# Patient Record
Sex: Male | Born: 1970 | Race: White | Hispanic: No | Marital: Married | Smoking: Former smoker
Health system: Southern US, Community
[De-identification: ages and names within clinical notes are randomized; demographics above are authoritative.]

## PROBLEM LIST (undated history)

## (undated) DIAGNOSIS — Z8619 Personal history of other infectious and parasitic diseases: Secondary | ICD-10-CM

## (undated) HISTORY — DX: Personal history of other infectious and parasitic diseases: Z86.19

## (undated) HISTORY — PX: TONSILLECTOMY AND ADENOIDECTOMY: SUR1326

---

## 2006-03-11 ENCOUNTER — Emergency Department: Payer: Self-pay | Admitting: Emergency Medicine

## 2013-08-02 ENCOUNTER — Encounter: Payer: Self-pay | Admitting: Family Medicine

## 2013-08-02 ENCOUNTER — Ambulatory Visit (INDEPENDENT_AMBULATORY_CARE_PROVIDER_SITE_OTHER): Payer: 59 | Admitting: Family Medicine

## 2013-08-02 VITALS — BP 140/92 | HR 81 | Temp 98.2°F | Ht 68.5 in | Wt 179.5 lb

## 2013-08-02 DIAGNOSIS — S99919A Unspecified injury of unspecified ankle, initial encounter: Secondary | ICD-10-CM

## 2013-08-02 DIAGNOSIS — S8990XA Unspecified injury of unspecified lower leg, initial encounter: Secondary | ICD-10-CM

## 2013-08-02 DIAGNOSIS — S99929A Unspecified injury of unspecified foot, initial encounter: Secondary | ICD-10-CM

## 2013-08-02 DIAGNOSIS — S8991XA Unspecified injury of right lower leg, initial encounter: Secondary | ICD-10-CM | POA: Insufficient documentation

## 2013-08-02 NOTE — Progress Notes (Signed)
Pre visit review using our clinic review tool, if applicable. No additional management support is needed unless otherwise documented below in the visit note. 

## 2013-08-02 NOTE — Progress Notes (Signed)
BP 140/92  Pulse 81  Temp(Src) 98.2 F (36.8 C) (Oral)  Ht 5' 8.5" (1.74 m)  Wt 179 lb 8 oz (81.421 kg)  BMI 26.89 kg/m2  SpO2 98%   CC: new pt to establish care  Subjective:    Patient ID: Alan Daniels, male    DOB: [birth year], 43 y.o.   MRN: [id number]  HPI: Alan DapperHoward Kaufman is a 43 y.o. male presenting on 08/02/2013 for Establish Care   [facility] Stressful few weeks at work - attributes slightly elevated blood pressure to this.  R knee pain - fall down stairs about 1.5 wks ago.  Slid down 3 steps and R knee got brunt of fall.  Describes R leg bent backwards.  Some pain immediately but worse stiffness next morning.  Able to flex knee to 90 degrees but any more causes pain, more pain with lateral rotation of foot as well.  Denies knee swelling or bruising.  No paresthesias.  No weakness.  Worse with activity/exertion.  Significant walking at work currently.  No h/o knee injury/surgery in the past.  Has been using knee brace since fall.  Hasn't tried meds for this.  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 Edu: HS Activity: active with children Diet: good water, fruits/vegetables daily  Relevant past medical, surgical, family and social history reviewed and updated as indicated.  Allergies and medications reviewed and updated. No current outpatient prescriptions on file prior to visit.   No current facility-administered medications on file prior to visit.    Review of Systems Per HPI unless specifically indicated above    Objective:    BP 140/92  Pulse 81  Temp(Src) 98.2 F (36.8 C) (Oral)  Ht 5' 8.5" (1.74 m)  Wt 179 lb 8 oz (81.421 kg)  BMI 26.89 kg/m2  SpO2 98%  Physical Exam  Nursing note and vitals reviewed. Constitutional: He is  oriented to person, place, and time. He appears well-developed and well-nourished. No distress.  HENT:  Head: Normocephalic and atraumatic.  Mouth/Throat: Uvula is midline, oropharynx is clear and moist and mucous membranes are normal.  Eyes: Conjunctivae and EOM are normal. Pupils are equal, round, and reactive to light. No scleral icterus.  Neck: Normal range of motion. Neck supple. No thyromegaly present.  Cardiovascular: Normal rate, regular rhythm, normal heart sounds and intact distal pulses.   No murmur heard. Pulses:      Radial pulses are 2+ on the right side, and 2+ on the left side.  Pulmonary/Chest: Effort normal and breath sounds normal. No respiratory distress. He has no wheezes. He has no rales.  Musculoskeletal: He exhibits no edema.       Right knee: He exhibits decreased range of motion. Tenderness found. MCL tenderness noted.       Left knee: Normal.  L knee WNL R Knee exam: No deformity on inspection. No bruising noted Tender to palpation along R MCL  No significant effusion/swelling noted. FROM in extension but limited to 100d with flexion No popliteal fullness. Neg drawer test. Neg mcmurray test. Mild pain with valgus stressing. No PFgrind. No abnormal patellar mobility. No pain with palpation of VMO, muscle mass symmetrical on both sides  Lymphadenopathy:    He has no cervical adenopathy.  Neurological: He is alert and oriented  to person, place, and time.  CN grossly intact, station and gait intact  Skin: Skin is warm and dry. No rash noted.  Psychiatric: He has a normal mood and affect. His behavior is normal. Judgment and thought content normal.   No results found for this or any previous visit.    Assessment & Plan:   Problem List Items Addressed This Visit   Right knee injury - Primary     Anticipate R MCL strain not full tear.  Not consistent with meniscal or muscular injury today.  ACL/PCL seem intact today. Recommend continued brace use, start  ibuprofen 400-600mg  bid with meals for 5 days, start icing knee at night time, and continue elevation at night. Provided with patient handout on MCL strain from [facility] pt advisor. I did recommend he return to see our SM doc in 1 week for follow up visit, return sooner if any worsening sxs. Pt agrees with plan.    Relevant Orders      Ambulatory referral to Sports Medicine       Follow up plan: Return as needed, for follow up with sports medicine.

## 2013-08-02 NOTE — Patient Instructions (Signed)
I think you have medial ligament strain. Start taking ibuprofen 400-600mg  twice a day with meals for next 5 days.  Ice leg when you're at home. Continue brace during the day. If worsening instead of improving let us know otherwise follow up in 1 week with Dr. [facility] for further rehab.

## 2013-08-02 NOTE — Assessment & Plan Note (Signed)
Anticipate R MCL strain not full tear.  Not consistent with meniscal or muscular injury today.  ACL/PCL seem intact today. Recommend continued brace use, start ibuprofen 400-600mg  bid with meals for 5 days, start icing knee at night time, and continue elevation at night. Provided with patient handout on [facility] strain [facility]. I did recommend he return to see our SM doc in 1 week for follow up visit, return sooner if any worsening sxs. Pt agrees with plan.

## 2013-08-09 ENCOUNTER — Ambulatory Visit (INDEPENDENT_AMBULATORY_CARE_PROVIDER_SITE_OTHER)
Admission: RE | Admit: 2013-08-09 | Discharge: 2013-08-09 | Disposition: A | Discharge status: Home / Self Care | Payer: 59 | Source: Ambulatory Visit | Attending: Family Medicine | Admitting: Family Medicine

## 2013-08-09 ENCOUNTER — Encounter: Payer: Self-pay | Admitting: Family Medicine

## 2013-08-09 ENCOUNTER — Ambulatory Visit (INDEPENDENT_AMBULATORY_CARE_PROVIDER_SITE_OTHER): Payer: 59 | Admitting: Family Medicine

## 2013-08-09 ENCOUNTER — Ambulatory Visit: Payer: Self-pay | Admitting: Family Medicine

## 2013-08-09 VITALS — BP 132/84 | HR 93 | Temp 98.1°F | Wt 184.5 lb

## 2013-08-09 DIAGNOSIS — M25561 Pain in right knee: Secondary | ICD-10-CM

## 2013-08-09 DIAGNOSIS — S83419A Sprain of medial collateral ligament of unspecified knee, initial encounter: Secondary | ICD-10-CM

## 2013-08-09 DIAGNOSIS — M2390 Unspecified internal derangement of unspecified knee: Secondary | ICD-10-CM

## 2013-08-09 DIAGNOSIS — M25569 Pain in unspecified knee: Secondary | ICD-10-CM

## 2013-08-09 NOTE — Progress Notes (Signed)
Date:  08/09/2013   Name:  Alan Daniels   DOB:  May 31, 1970   MRN:  [id number]  Primary Physician: Eustaquio BoydenJavier Gutierrez, MD  Dear Dr. Sharen HonesGutierrez,  Thank you for having me see Alan Daniels in consultation today at [facility] for his problem with R knee pain.  As you may recall, he is a 43 y.o. year old male with a history of injuring his right knee about 2-1/2 weeks ago when he slid down several stairs. At that time his knee was forcefully flexed underneath him and his right leg bent backwards. He did have initial pain, but he did not have immediate effusion in his knee. He has been able to ambulate, but it has been somewhat difficult, and he is been limited some in the motion of his knee.  He has been using a hinged knee brace for support, and he has been using up until a couple of days ago when he stopped it when he thought his knee was feeling better. After that day, he started to develop some significantly more knee pain, and so he is wearing his knee brace once again today.  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  He has tried to take some Advil, and this is minimally helped.  He has no prior significant history of injury to his knee, no prior operative intervention and no prior fractures.  Past Medical History  Diagnosis Date  . History of chicken pox childhood    Past Surgical History  Procedure Laterality Date  . Tonsillectomy and adenoidectomy      Childhood    History   Social History  . Marital Status: Married    Spouse Name: N/A    Number of Children: N/A  . Years of Education: N/A   Social History Main Topics  . Smoking status: Former Smoker -- 1.00 packs/day for 10 years    Types: Cigarettes    Quit date: 08/03/1995  . Smokeless tobacco: Never Used  . Alcohol Use: Yes     Comment: once or twice a week  . Drug Use: No  . Sexual Activity: Yes    Partners: Female   Other Topics  Concern  . 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   Edu: HS   Activity: active with children   Diet: good water, fruits/vegetables daily    Family History  Problem Relation Age of Onset  . Hyperlipidemia Mother   . Alcohol abuse Father   . Heart failure Father   . Hypertension Father   . Cancer Neg Hx   . Diabetes Neg Hx   . CAD Neg Hx   . Stroke Neg Hx     Medications and Allergies reviewed  Review of Systems:    GEN: No fevers, chills. Nontoxic. Primarily MSK c/o today. MSK: Detailed in the HPI GI: tolerating PO intake without difficulty Neuro: No numbness, parasthesias, or tingling associated. Otherwise the pertinent positives of the ROS are noted above.   Physical Examination: BP 132/84  Pulse 93  Temp(Src) 98.1 F (36.7 C) (Oral)  Wt 184 lb 8 oz (83.689 kg)  SpO2 97%    GEN: WDWN, NAD, Non-toxic, Alert & Oriented x 3 HEENT: Atraumatic, Normocephalic.  Ears and Nose: No external deformity. EXTR: No clubbing/cyanosis/edema PSYCH: Normally interactive. Conversant. Not  depressed or anxious appearing.  Calm demeanor.   Knee:  RIGHT Gait: notably antalgic heel toe pattern ROM: 0-100 Effusion: minimal  Echymosis or edema: none Patellar tendon. Mildly tender at the distal patellar tendon insertion Painful PLICA: neg Patellar grind: negative Medial and lateral patellar facet loading: negative medial and lateral joint lines: MARKEDLY TENDER ON THE MEDIAL JOINT LINE Mcmurray's MARKEDLY POSITIVE Flexion-pinch MARKEDLY POSITIVE Varus and valgus stress: MCL and LCL are stable. No pain with varus stress. Valgus stress does cause only a minimal amount of pain. Lachman: neg Ant and Post drawer: neg Hip abduction, IR, ER: WNL Hip flexion str: 5/5 Hip abd: 5/5 Quad: 5/5 VMO atrophy:No Hamstring concentric and eccentric:  5/5   Objective Data: X-rays: AP Weight-bearing, Weightbearing Lateral, Sunrise views, R Indication: knee pain Findings:  Joint spaces are well preserved. There is no foreign body visualized. There is no evidence of occult fracture or dislocation. On the sunrise view the patient's patella is deviated somewhat laterally in the patellar groove. Otherwise, these are unremarkable knee films.  The radiological images were independently reviewed by myself in the office. My independent interpretation of images are above. Electronically Signed  By: Hannah BeatSpencer Maris Bena, MD On: 08/09/2013 6:58 PM   Assessment and Plan: 1. Highly concerning for medial meniscal tear 2. Mild grade 1 MCL sprain 3. Mild injury at patellar tendon insertion distally  Recommendations: We reviewed different treatment approaches. I am highly concerned that the patient has a medial meniscal tear. At this point, he would like to proceed conservatively, which I think is certainly reasonable. We are going to have him rest his knee over the next several weeks, continue to wear his hinged knee brace over the next several days. He denies his knee after work, and take either Advil or Aleve.  We will see the patient back in 4 weeks. At that time, the patient's knee is still considerably injured, similar to today, I would recommend an MRI of the right knee versus direct arthroscopy.  Thank you for having us see Alan Daniels in consultation.  Feel free to contact me with any questions.  Signed,  Elpidio GaleaSpencer T. Jhon Mallozzi, MD, CAQ Sports Medicine  [facility] [address] [place], [place] [address] Phone: [phone] Fax: [phone]

## 2013-08-09 NOTE — Progress Notes (Signed)
Pre visit review using our clinic review tool, if applicable. No additional management support is needed unless otherwise documented below in the visit note. 

## 2013-08-14 ENCOUNTER — Telehealth: Payer: Self-pay

## 2013-08-14 NOTE — Telephone Encounter (Signed)
Mr. Alan Daniels notified as instructed by telephone.

## 2013-08-14 NOTE — Telephone Encounter (Signed)
i thought that we had discussed in the office. Grossly normal. No significant arthritis, fracture, or dislocation.

## 2013-08-14 NOTE — Telephone Encounter (Signed)
Pt left v/m requesting knee xray results.

## 2013-08-16 ENCOUNTER — Ambulatory Visit: Payer: Self-pay | Admitting: Family Medicine

## 2013-09-13 ENCOUNTER — Ambulatory Visit: Payer: 59 | Admitting: Family Medicine

## 2016-06-10 ENCOUNTER — Ambulatory Visit (INDEPENDENT_AMBULATORY_CARE_PROVIDER_SITE_OTHER): Payer: BLUE CROSS/BLUE SHIELD | Admitting: Primary Care

## 2016-06-10 ENCOUNTER — Encounter: Payer: Self-pay | Admitting: Primary Care

## 2016-06-10 VITALS — BP 162/110 | HR 91 | Temp 98.0°F | Ht 68.5 in | Wt 188.1 lb

## 2016-06-10 DIAGNOSIS — G44019 Episodic cluster headache, not intractable: Secondary | ICD-10-CM | POA: Diagnosis not present

## 2016-06-10 MED ORDER — METHYLPREDNISOLONE ACETATE 80 MG/ML IJ SUSP
80.0000 mg | Freq: Once | INTRAMUSCULAR | Status: AC
  Administered 2016-06-10: 80 mg via INTRAMUSCULAR

## 2016-06-10 NOTE — Addendum Note (Signed)
Addended by: Tawnya CrookSAMBATH, Emerson Schreifels on: 06/10/2016 04:57 PM   Modules accepted: Orders

## 2016-06-10 NOTE — Progress Notes (Signed)
Pre visit review using our clinic review tool, if applicable. No additional management support is needed unless otherwise documented below in the visit note. 

## 2016-06-10 NOTE — Patient Instructions (Signed)
You were provided with an injection of Depo Medrol which is a steroid to abort your headache and inflammation.  Do not take any additional Advil for the rest of the evening.   Please call me if no improvement in 24 hours.  Monitor your blood pressure and notify Dr. Sharen Hones if you notice readings consistently above 140/90.  It was a pleasure meeting you!   Cluster Headache A cluster headache is a type of headache that causes deep, intense head pain. Cluster headaches can last from 15 minutes to 3 hours. They usually occur:  On one side of the head. They may occur on the other side when a new cluster of headaches begins.  Repeatedly over weeks to months.  Several times a day.  At the same time of day, often at night.  More often in the fall and springtime. What are the causes? The cause of this condition is not known. What increases the risk? This condition is more likely to develop in:  Males.  People who drink alcohol.  People who smoke or use products that contain nicotine or tobacco.  People who take medicines that cause blood vessels to expand, such as nitroglycerin.  People who take antihistamines. What are the signs or symptoms? Symptoms of this condition include:  Severe pain on one side of the head that begins behind or around your eye or temple.  Pain on one side of the head.  Nausea.  Sensitivity to light.  Runny nose and nasal stuffiness.  Sweaty, pale skin on the face.  Droopy or swollen eyelid, eye redness, or tearing.  Restlessness and agitation. How is this diagnosed? This condition may be diagnosed based on:  Your symptoms.  A physical exam. Your health care provider may order tests to see if your headaches are caused by another medical condition. These tests may show that you do not have cluster headaches. Tests may include:  A CT scan of your head.  An MRI of your head.  Lab tests. How is this treated? This condition may be  treated with:  Medicines to relieve pain and to prevent repeated (recurrent) attacks. Some people may need a combination of medicines.  Oxygen. This helps to relieve pain. Follow these instructions at home: Headache diary  Keep a headache diary as told by your health care provider. Doing this can help you and your health care provider figure out what triggers your headaches. In your headache diary, include information about:  The time of day that your headache started and what you were doing when it began.  How long your headache lasted.  Where your pain started and whether it moved to other areas.  The type of pain, such as burning, stabbing, throbbing, or cramping.  Your level of pain. Use a pain scale and rate the pain with a number from 1 (mild) up to 10 (severe).  The treatment that you used, and any change in symptoms after treatment. Medicines  Take over-the-counter and prescription medicines only as told by your health care provider.  Do not drive or use heavy machinery while taking prescription pain medicine.  Use oxygen as told by your health care provider. Lifestyle  Follow a regular sleep schedule. Do not vary the time that you go to bed or the amount that you sleep from day to day. It is important to stay on the same schedule during a cluster period to help prevent headaches.  Exercise regularly.  Eat a healthy diet and avoid foods that  may trigger your headaches.  Avoid alcohol.  Do not use any products that contain nicotine or tobacco, such as cigarettes and e-cigarettes. If you need help quitting, ask your health care provider. Contact a health care provider if:  Your headaches change, become more severe, or occur more often.  The medicine or oxygen that your health care provider recommended does not help. Get help right away if:  You faint.  You have weakness or numbness, especially on one side of your body or face.  You have double vision.  You  have nausea or vomiting that does not go away within several hours.  You have trouble talking, walking, or keeping your balance.  You have pain or stiffness in your neck.  You have a fever. Summary  A cluster headache is a type of headache that causes deep, intense head pain, usually on one side of the head.  Keep a headache diary to help discover what triggers your headaches.  A regular sleep schedule can help prevent headaches. This information is not intended to replace advice given to you by your health care provider. Make sure you discuss any questions you have with your health care provider. Document Released: 04/13/2005 Document Revised: 12/24/2015 Document Reviewed: 12/24/2015 Elsevier Interactive Patient Education  2017 [employer]

## 2016-06-10 NOTE — Progress Notes (Signed)
Subjective:    Patient ID: Alan Daniels, male    DOB: [birth year], 46 y.o.   MRN: [id number]  HPI  Alan Daniels is a 46 year old male who presents today with a chief complaint of headache. He also reports inflammation to his eye lid, watering of eyes, photophobia. His headache is located behind his right eye that has been present intermittently for the past 4 days. He describes his pain as throbbing and sharp.  He denies numbness/tingling, fevers, cough, nausea. He's taken Advil with temporary improvement.   Review of Systems  Constitutional: Negative for fatigue and fever.  HENT: Negative for congestion.   Eyes: Positive for photophobia.       Inflammation to right eye with watery eye.  Gastrointestinal: Negative for nausea.  Neurological: Positive for headaches.       Past Medical History:  Diagnosis Date  . History of chicken pox childhood     Social History   Social History  . Marital status: Married    Spouse name: N/A  . Number of children: N/A  . Years of education: N/A   Occupational History  . Not on file.   Social History Main Topics  . Smoking status: Former Smoker    Packs/day: 1.00    Years: 10.00    Types: Cigarettes    Quit date: 08/03/1995  . Smokeless tobacco: Never Used  . Alcohol use Yes     Comment: once or twice a week  . Drug use: No  . Sexual activity: Yes    Partners: Female   Other Topics Concern  . 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   Edu: HS   Activity: active with children   Diet: good water, fruits/vegetables daily    Past Surgical History:  Procedure Laterality Date  . TONSILLECTOMY AND ADENOIDECTOMY     Childhood    Family History  Problem Relation Age of Onset  . Hyperlipidemia Mother   . Alcohol abuse Father   . Heart failure Father   . Hypertension Father   .  Cancer Neg Hx   . Diabetes Neg Hx   . CAD Neg Hx   . Stroke Neg Hx     No Known Allergies  No current outpatient prescriptions on file prior to visit.   No current facility-administered medications on file prior to visit.     BP (!) 162/110   Pulse 91   Temp 98 F (36.7 C) (Oral)   Ht 5' 8.5" (1.74 m)   Wt 188 lb 1.9 oz (85.3 kg)   SpO2 96%   BMI 28.19 kg/m    Objective:   Physical Exam  Constitutional: He appears well-nourished.  HENT:  Right Ear: Tympanic membrane and ear canal normal.  Left Ear: Tympanic membrane and ear canal normal.  Nose: No mucosal edema. Right sinus exhibits no maxillary sinus tenderness and no frontal sinus tenderness. Left sinus exhibits no maxillary sinus tenderness and no frontal sinus tenderness.  Mouth/Throat: Oropharynx is clear and moist.  Eyes: Conjunctivae and EOM are normal. Pupils are equal, round, and reactive to light.  Mild swelling to right upper eye lid.   Neck: Neck supple.  Cardiovascular: Normal rate and regular rhythm.   Pulmonary/Chest: Effort normal and breath sounds normal. He has no wheezes. He has no rales.  Neurological: No cranial nerve deficit.  No decrease in sensation to face  Skin: Skin is warm and dry.          Assessment & Plan:  Cluster Headache:  Headache x 4 days, intermittent photophobia, eye irritation. Located behind right eye. Little improvement with OTC treatment. Neuro exam unremarkable. No suspicion for Bell's Palsy. Examination and symptoms represent cluster headache. Treat today with IM Depo Medrol. Hold Advil. Discussed to notify if no improvement in 24 hours. Also will have him monitor BP and report readings at or above 140/90 to PCP.  Morrie Sheldon, NP

## 2016-06-11 ENCOUNTER — Telehealth: Payer: Self-pay | Admitting: Primary Care

## 2016-06-11 ENCOUNTER — Telehealth: Payer: Self-pay | Admitting: Family Medicine

## 2016-06-11 DIAGNOSIS — G44009 Cluster headache syndrome, unspecified, not intractable: Secondary | ICD-10-CM

## 2016-06-11 NOTE — Telephone Encounter (Signed)
FYI Spouse called stating pt headache is not any better.  He made appointment with you for tomorrow

## 2016-06-11 NOTE — Telephone Encounter (Signed)
Noted  

## 2016-06-11 NOTE — Telephone Encounter (Signed)
Please call patient and tell him that I can send in a medication to his pharmacy that should abort his medication rather than coming in for another office visit. If he's agreeable I will send.

## 2016-06-12 ENCOUNTER — Ambulatory Visit: Payer: BLUE CROSS/BLUE SHIELD | Admitting: Primary Care

## 2016-06-12 MED ORDER — SUMATRIPTAN SUCCINATE 50 MG PO TABS: ORAL_TABLET | ORAL | 0 refills | Status: DC

## 2016-06-12 NOTE — Telephone Encounter (Signed)
Spoken and notified patient of Kate's comments. Patient verbalized understanding.  Patient stated that he is agreeable [facility] [address]

## 2016-06-12 NOTE — Telephone Encounter (Signed)
Noted. Rx for Imitrex sent to pharmacy.

## 2016-06-16 ENCOUNTER — Other Ambulatory Visit: Payer: Self-pay

## 2016-06-16 ENCOUNTER — Other Ambulatory Visit: Payer: Self-pay | Admitting: Primary Care

## 2016-06-16 DIAGNOSIS — G44009 Cluster headache syndrome, unspecified, not intractable: Secondary | ICD-10-CM

## 2016-06-16 NOTE — Telephone Encounter (Signed)
Male left v/m at 4:58pm requesting refill sumatriptan 50 mg. Last filled # 4 on 06/12/16. Pt has taken the 4 tablets and request refill. [facility] [address] Pt seen 06/10/16.Please advise.

## 2016-06-16 NOTE — Telephone Encounter (Signed)
If he's needing a refill then that means his headache has not dissipated. Any improvement? If not then please schedule him for follow up this week with his PCP. Thanks.

## 2016-06-17 NOTE — Telephone Encounter (Signed)
Alan Daniels request cb about sumatriptan refill.

## 2016-06-17 NOTE — Telephone Encounter (Signed)
Spoken to patient's wife and notified of Kate's comments.

## 2016-06-17 NOTE — Telephone Encounter (Addendum)
Addressed on another telephone encounter

## 2016-06-17 NOTE — Telephone Encounter (Signed)
Will call and schedule appointment with Dr Sharen HonesGutierrez

## 2016-06-18 ENCOUNTER — Ambulatory Visit (INDEPENDENT_AMBULATORY_CARE_PROVIDER_SITE_OTHER): Payer: BLUE CROSS/BLUE SHIELD | Admitting: Family Medicine

## 2016-06-18 ENCOUNTER — Encounter: Payer: Self-pay | Admitting: Family Medicine

## 2016-06-18 VITALS — BP 138/90 | HR 72 | Temp 97.7°F | Wt 184.5 lb

## 2016-06-18 DIAGNOSIS — G44009 Cluster headache syndrome, unspecified, not intractable: Secondary | ICD-10-CM | POA: Insufficient documentation

## 2016-06-18 DIAGNOSIS — G44011 Episodic cluster headache, intractable: Secondary | ICD-10-CM | POA: Diagnosis not present

## 2016-06-18 MED ORDER — VERAPAMIL HCL ER 120 MG PO TBCR: 120.0000 mg | EXTENDED_RELEASE_TABLET | Freq: Every day | ORAL | 3 refills | Status: DC

## 2016-06-18 MED ORDER — SUMATRIPTAN SUCCINATE 100 MG PO TABS: 50.0000 mg | ORAL_TABLET | ORAL | 0 refills | Status: DC | PRN

## 2016-06-18 MED ORDER — SUMATRIPTAN SUCCINATE 6 MG/0.5ML ~~LOC~~ SOLN
6.0000 mg | Freq: Once | SUBCUTANEOUS | Status: AC
  Administered 2016-06-18: 6 mg via SUBCUTANEOUS

## 2016-06-18 NOTE — Assessment & Plan Note (Addendum)
New, ongoing over last 2 weeks. Classical cluster headache. Autonomic symptoms present. Improved with steroid injection and sumatriptan 50mg  at home daily however recurred.  Treat today with 10 L/min oxygen via nonrebreather. Not fully improved, so we also treated with sumatriptan 6mg  IM.  Given duration of headache, start low dose verapamil preventatively. Pt may stop after 1 month if well controlled.  As new onset cluster HA, will check noncontrasted head CT to r/o secondary cause of cluster headache.  Update us with effect. Pt agrees with plan.

## 2016-06-18 NOTE — Progress Notes (Signed)
BP 138/90   Pulse 72   Temp 97.7 F (36.5 C) (Tympanic)   Wt 184 lb 8 oz (83.7 kg)   BMI 27.65 kg/m    CC: headache Subjective:    Patient ID: Shawnie DapperHoward Blakesley, male    DOB: Sep 18, 1970, 46 y.o.   MRN: [id number]  HPI: Shawnie DapperHoward Guettler is a 46 y.o. male presenting on 06/18/2016 for Headache (recheck)   Saw Mayra ReelKate Clark last week, dx with cluster headaches, treated with methylprednisolone 80mg  IM. Temporary relief x3 days. Then treated with imitrex 50mg  to pharmacy #4. He has taken one a day. + night sweats. Some nausea at its peak. +eye tearing, injection, rhinorrhea/congestion. Currently 8/10 pain. Headache worsens as day progresses.   Elevated blood pressure recently - ?pain related.   2.5 wk h/o R headache with R ptosis and puffiness of medial upper eyelid.  No pain on left side of head.  No fevers/chills, vomiting, vision changes.  No inciting trauma/injury or falls.  Ex smoker  No significant h/o headaches in the past.  BP Readings from Last 3 Encounters:  06/18/16 138/90  06/10/16 (!) 162/110  08/09/13 132/84     Relevant past medical, surgical, family and social history reviewed and updated as indicated. Interim medical history since our last visit reviewed. Allergies and medications reviewed and updated. Outpatient Medications Prior to Visit  Medication Sig Dispense Refill  . SUMAtriptan (IMITREX) 50 MG tablet Take 1 tablet by mouth at migraine onset. May repeat in 2 hours if headache persists or recurs. Caution as this may cause drowsiness. 4 tablet 0   No facility-administered medications prior to visit.      Per HPI unless specifically indicated in ROS section below Review of Systems     Objective:    BP 138/90   Pulse 72   Temp 97.7 F (36.5 C) (Tympanic)   Wt 184 lb 8 oz (83.7 kg)   BMI 27.65 kg/m   Wt Readings from Last 3 Encounters:  06/18/16 184 lb 8 oz (83.7 kg)  06/10/16 188 lb 1.9 oz (85.3 kg)  08/09/13 184 lb 8 oz (83.7 kg)    Physical Exam    Constitutional: He appears well-developed and well-nourished. No distress.  HENT:  Mouth/Throat: Oropharynx is clear and moist. No oropharyngeal exudate.  Eyes: Conjunctivae and EOM are normal. No scleral icterus.  R ptosis, miosis present  Neck: Normal range of motion. Neck supple. No thyromegaly present.  Cardiovascular: Normal rate, regular rhythm, normal heart sounds and intact distal pulses.   No murmur heard. Pulmonary/Chest: Effort normal and breath sounds normal. No respiratory distress. He has no wheezes. He has no rales.  Musculoskeletal: He exhibits no edema.  Lymphadenopathy:    He has no cervical adenopathy.  Neurological: He is alert. He has normal strength. No cranial nerve deficit or sensory deficit.  CN 2-12 intact FTN intact EOMI  Skin: Skin is warm and dry. No rash noted.  Psychiatric: He has a normal mood and affect.  Nursing note and vitals reviewed.  No results found for this or any previous visit.    Assessment & Plan:   Problem List Items Addressed This Visit    Cluster headache - Primary    New, ongoing over last 2 weeks. Classical cluster headache. Autonomic symptoms present. Improved with steroid injection and sumatriptan 50mg  at home daily however recurred.  Treat today with 10 L/min oxygen via nonrebreather. Not fully improved, so we also treated with sumatriptan 6mg  IM.  Given duration of  headache, start low dose verapamil preventatively. Pt may stop after 1 month if well controlled.  As new onset cluster HA, will check noncontrasted head CT to r/o secondary cause of cluster headache.  Update [place] with effect. Pt agrees with plan.      Relevant Medications   verapamil (CALAN-SR) 120 MG CR tablet   SUMAtriptan (IMITREX) 100 MG tablet   Other Relevant Orders   CT Head Wo Contrast       Follow up plan: Return if symptoms worsen or fail to improve.  Eustaquio Boyden, MD

## 2016-06-18 NOTE — Addendum Note (Signed)
Addended by: Josph MachoANCE, KIMBERLY A on: 06/18/2016 05:01 PM   Modules accepted: Orders

## 2016-06-18 NOTE — Patient Instructions (Addendum)
You have classic cluster headache. Treated with oxygen in office and sumatriptan injection today as well.  Take verapamil at home daily preventative medicine over the next month. If doing well, may stop after 2-4 weeks. Take sumatriptan 50-100mg  as needed for headache (as needed).  Given new onset headaches, let's check head CT as well. See Shirlee LimerickMarion on your way out.  Update us with effect over the next 1-2 weeks.

## 2016-06-18 NOTE — Progress Notes (Signed)
Pre visit review using our clinic review tool, if applicable. No additional management support is needed unless otherwise documented below in the visit note. 

## 2016-06-23 ENCOUNTER — Ambulatory Visit (INDEPENDENT_AMBULATORY_CARE_PROVIDER_SITE_OTHER)
Admission: RE | Admit: 2016-06-23 | Discharge: 2016-06-23 | Disposition: A | Discharge status: Home / Self Care | Payer: BLUE CROSS/BLUE SHIELD | Source: Ambulatory Visit | Attending: Family Medicine | Admitting: Family Medicine

## 2016-06-23 DIAGNOSIS — G44011 Episodic cluster headache, intractable: Secondary | ICD-10-CM

## 2016-08-28 ENCOUNTER — Other Ambulatory Visit: Payer: Self-pay | Admitting: Family Medicine

## 2016-11-12 ENCOUNTER — Other Ambulatory Visit: Payer: Self-pay | Admitting: Family Medicine

## 2016-11-12 NOTE — Telephone Encounter (Signed)
Last office visit 06/18/2016.  AVS states if doing well, can stop Verapamil in 2 to 4 weeks.  Refill?

## 2018-09-09 IMAGING — CT CT HEAD W/O CM
3 series · 15 of 33 positions shown, 18 images · non-contrast
Comparison: None.

CLINICAL DATA: Right-sided cluster headaches for 3 weeks

EXAM:
CT HEAD WITHOUT CONTRAST
TECHNIQUE: Contiguous axial images were obtained from the base of the skull
through the vertex without intravenous contrast.

[Series 2: head 5.0 h37s · axial · 0.42mm/px · z∈[+147,+272]mm · 7 of 31 slices shown, 9 images]
[im 3/31  soft-tissue]
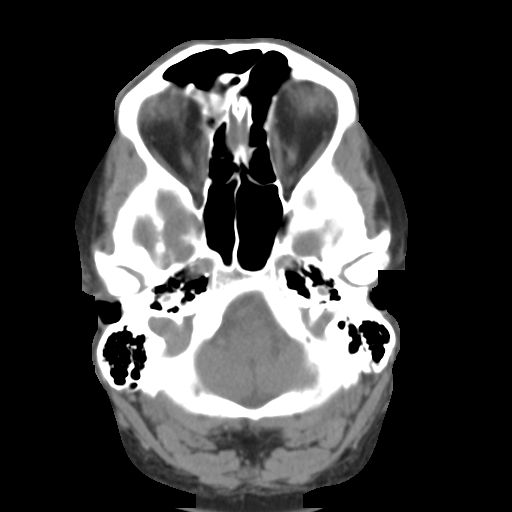
[im 3/31  bone]
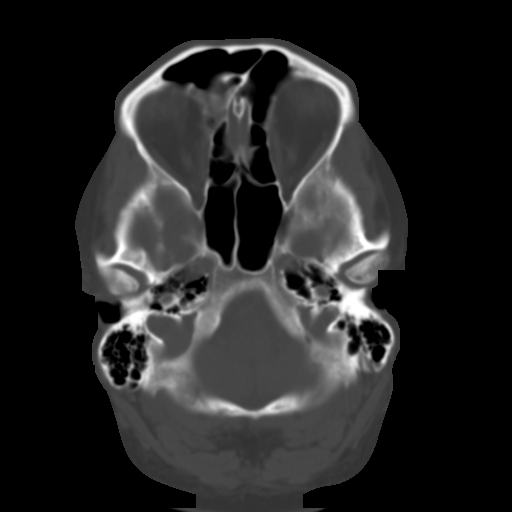
[im 7/31  bone]
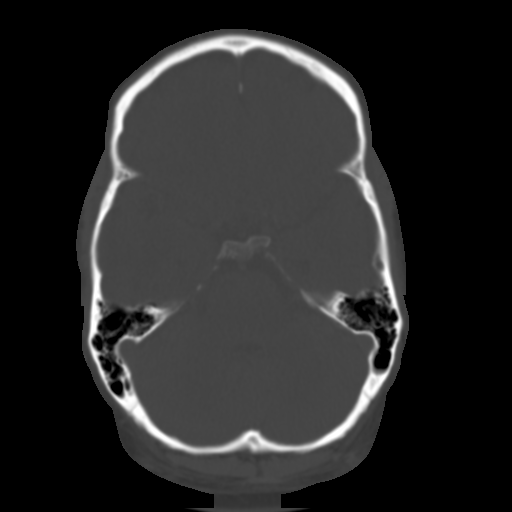
[im 12/31  bone]
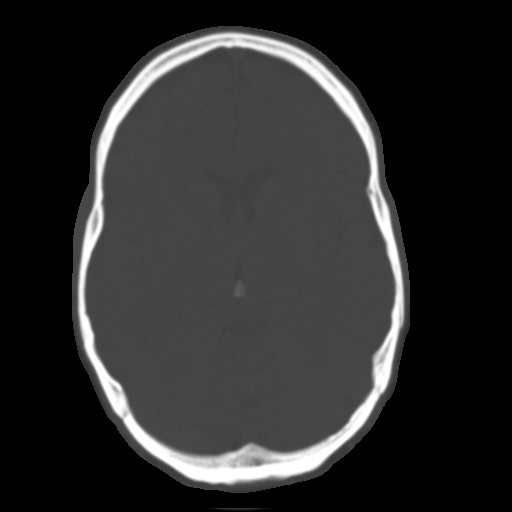
[im 17/31  bone]
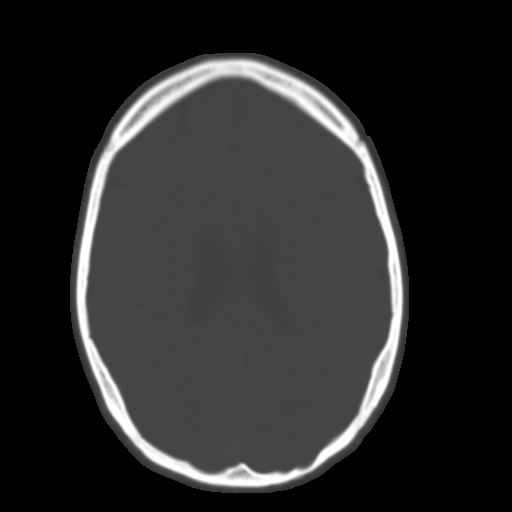
[im 19/31  soft-tissue]
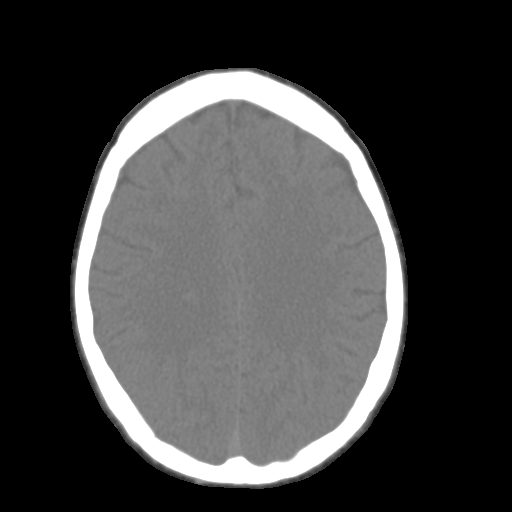
[im 19/31  bone]
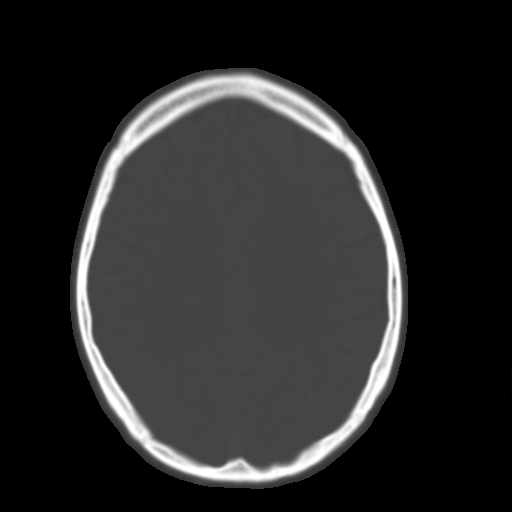
[im 24/31  bone]
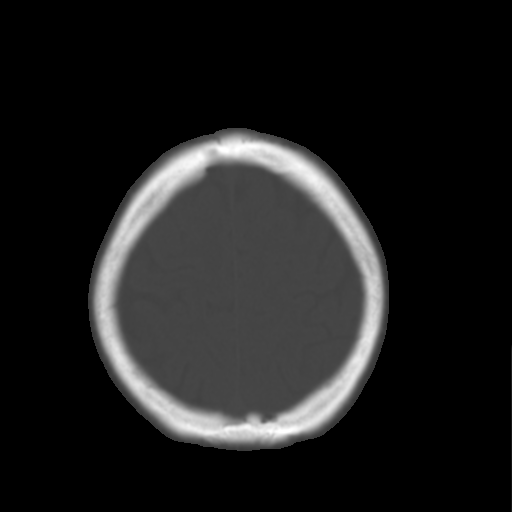
[im 28/31  bone]
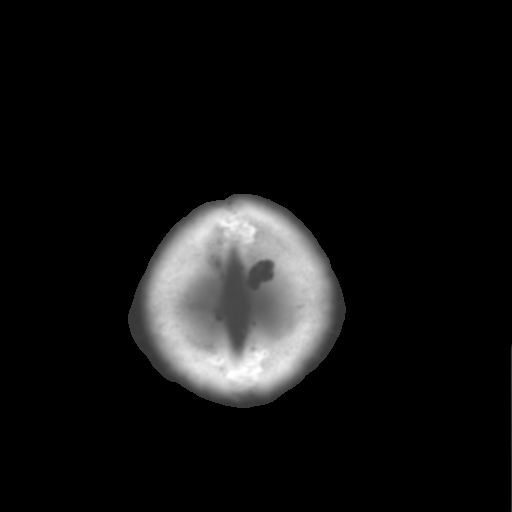

[Series 4: head 3.0 mpr cor · coronal · 0.30mm/px · 3 of 67 slices shown]
[im 14/67  bone]
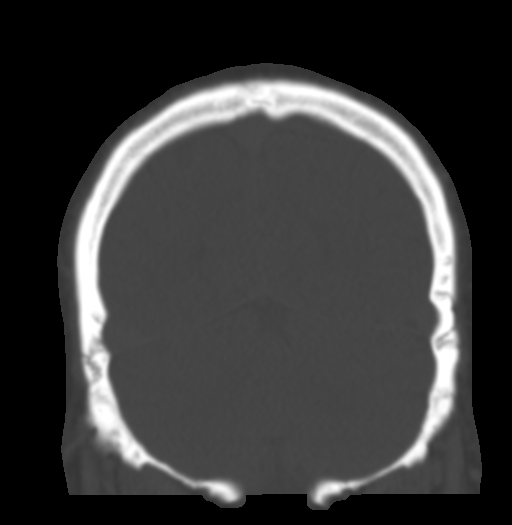
[im 27/67  bone]
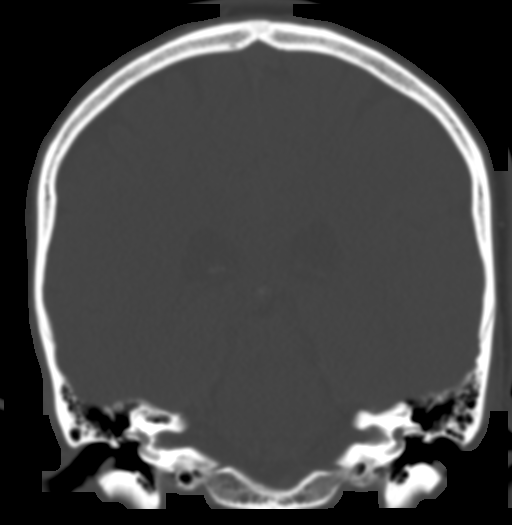
[im 40/67  bone]
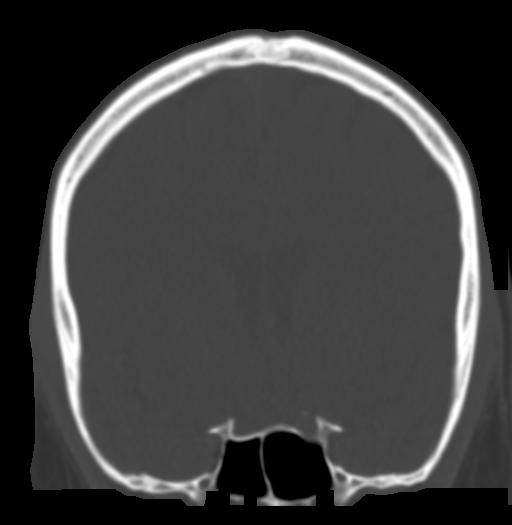

[Series 5: head 3.0 mpr sag · sagittal · 0.29mm/px · 5 of 52 slices shown, 6 images]
[im 18/52  bone]
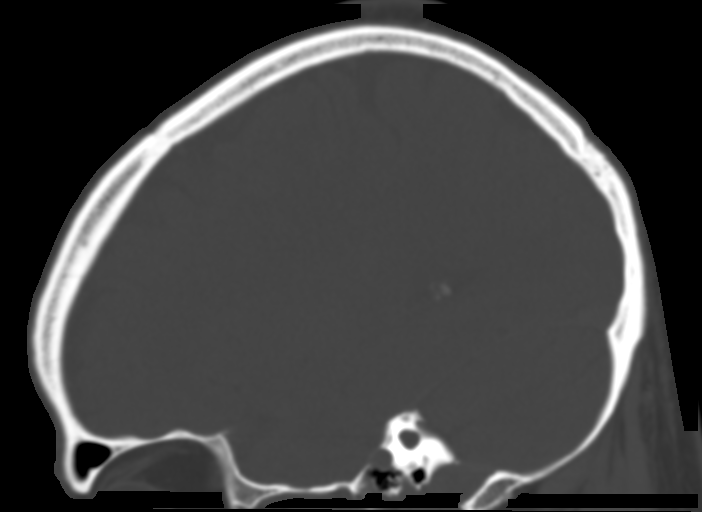
[im 22/52  bone]
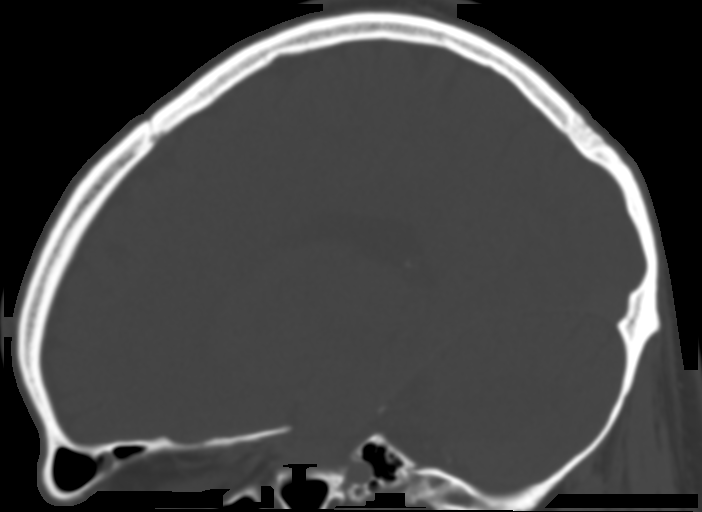
[im 26/52  soft-tissue]
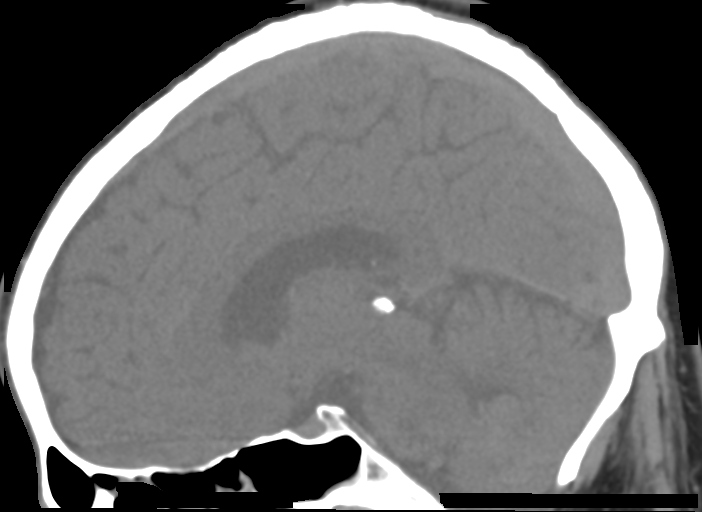
[im 26/52  bone]
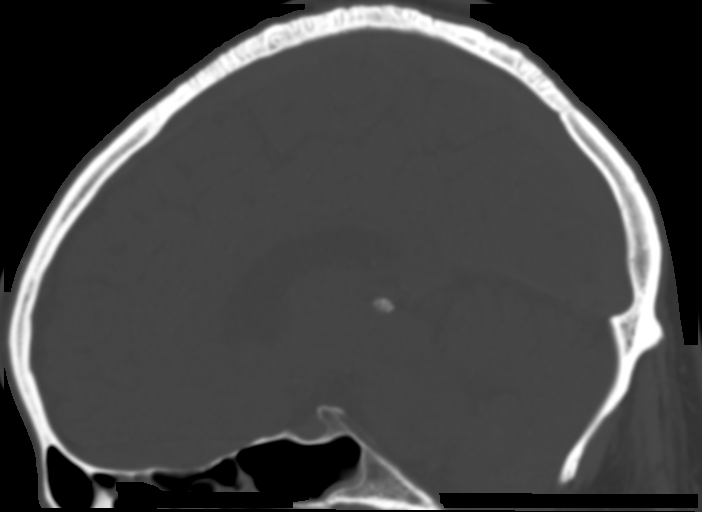
[im 30/52  bone]
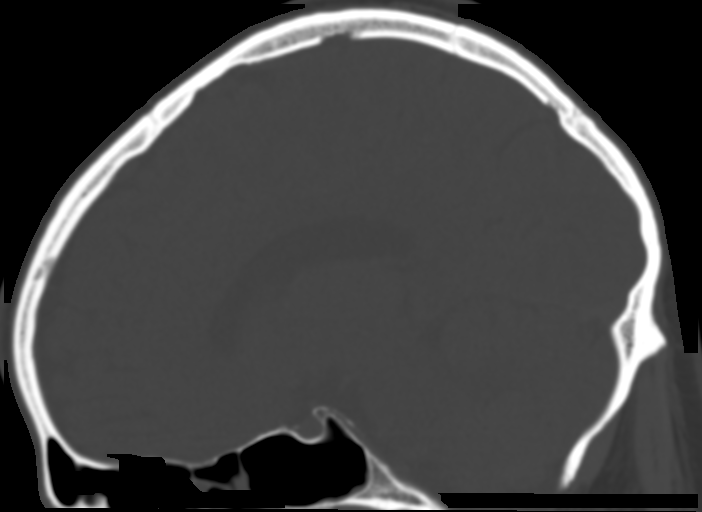
[im 35/52  bone]
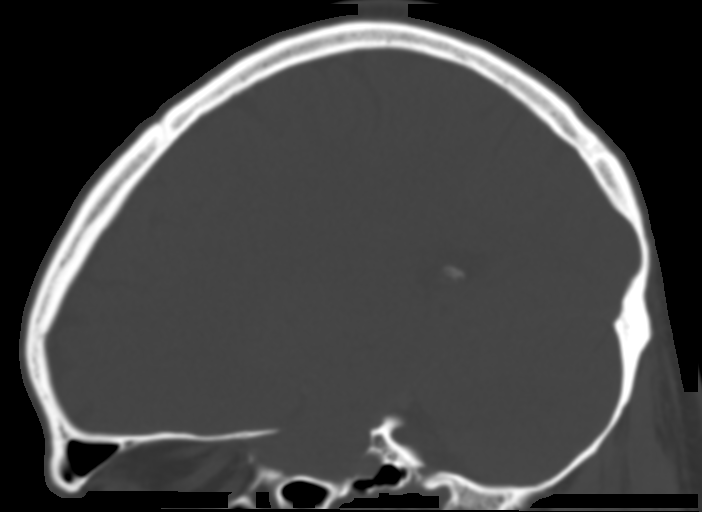

[15 of 33 positions shown; findings below may reference images not displayed]

FINDINGS: Brain: No evidence of acute infarction, hemorrhage, hydrocephalus,
extra-axial collection or mass lesion/mass effect.

Vascular: No hyperdense vessel or unexpected calcification.

Skull: Normal. Negative for fracture or focal lesion.

Sinuses/Orbits: No acute finding.

Other: None.
IMPRESSION: No acute intracranial abnormality noted.
